# Patient Record
Sex: Female | Born: 1940 | Race: White | Hispanic: No | Smoking: Never smoker
Health system: Southern US, Community
[De-identification: ages and names within clinical notes are randomized; demographics above are authoritative.]

## PROBLEM LIST (undated history)

## (undated) DIAGNOSIS — F419 Anxiety disorder, unspecified: Secondary | ICD-10-CM

## (undated) DIAGNOSIS — G2581 Restless legs syndrome: Secondary | ICD-10-CM

## (undated) DIAGNOSIS — J449 Chronic obstructive pulmonary disease, unspecified: Secondary | ICD-10-CM

## (undated) DIAGNOSIS — M159 Polyosteoarthritis, unspecified: Secondary | ICD-10-CM

## (undated) DIAGNOSIS — E559 Vitamin D deficiency, unspecified: Secondary | ICD-10-CM

## (undated) DIAGNOSIS — M797 Fibromyalgia: Secondary | ICD-10-CM

## (undated) DIAGNOSIS — G894 Chronic pain syndrome: Secondary | ICD-10-CM

## (undated) DIAGNOSIS — I959 Hypotension, unspecified: Secondary | ICD-10-CM

## (undated) DIAGNOSIS — I509 Heart failure, unspecified: Secondary | ICD-10-CM

## (undated) DIAGNOSIS — N200 Calculus of kidney: Secondary | ICD-10-CM

## (undated) DIAGNOSIS — I4891 Unspecified atrial fibrillation: Secondary | ICD-10-CM

## (undated) DIAGNOSIS — G4733 Obstructive sleep apnea (adult) (pediatric): Secondary | ICD-10-CM

## (undated) DIAGNOSIS — I2510 Atherosclerotic heart disease of native coronary artery without angina pectoris: Secondary | ICD-10-CM

## (undated) DIAGNOSIS — M545 Low back pain, unspecified: Secondary | ICD-10-CM

## (undated) DIAGNOSIS — E119 Type 2 diabetes mellitus without complications: Secondary | ICD-10-CM

## (undated) DIAGNOSIS — I639 Cerebral infarction, unspecified: Secondary | ICD-10-CM

## (undated) DIAGNOSIS — E876 Hypokalemia: Secondary | ICD-10-CM

## (undated) DIAGNOSIS — C799 Secondary malignant neoplasm of unspecified site: Secondary | ICD-10-CM

## (undated) DIAGNOSIS — K219 Gastro-esophageal reflux disease without esophagitis: Secondary | ICD-10-CM

## (undated) HISTORY — PX: OTHER SURGICAL HISTORY: SHX169

## (undated) HISTORY — PX: CHOLECYSTECTOMY: SHX55

## (undated) HISTORY — PX: PACEMAKER IMPLANT: EP1218

## (undated) HISTORY — PX: ABDOMINAL HYSTERECTOMY: SHX81

## (undated) HISTORY — PX: CORONARY ARTERY BYPASS GRAFT: SHX141

## (undated) HISTORY — PX: LUNG SURGERY: SHX703

---

## 2017-02-18 ENCOUNTER — Emergency Department: Payer: Medicare Other

## 2017-02-18 ENCOUNTER — Observation Stay
Admission: EM | Admit: 2017-02-18 | Discharge: 2017-02-21 | Disposition: A | Discharge status: Home / Self Care | Payer: Medicare Other | Attending: Internal Medicine | Admitting: Internal Medicine

## 2017-02-18 ENCOUNTER — Encounter: Payer: Self-pay | Admitting: Emergency Medicine

## 2017-02-18 DIAGNOSIS — I951 Orthostatic hypotension: Secondary | ICD-10-CM | POA: Insufficient documentation

## 2017-02-18 DIAGNOSIS — M159 Polyosteoarthritis, unspecified: Secondary | ICD-10-CM | POA: Insufficient documentation

## 2017-02-18 DIAGNOSIS — E1122 Type 2 diabetes mellitus with diabetic chronic kidney disease: Secondary | ICD-10-CM | POA: Diagnosis not present

## 2017-02-18 DIAGNOSIS — Z7982 Long term (current) use of aspirin: Secondary | ICD-10-CM | POA: Insufficient documentation

## 2017-02-18 DIAGNOSIS — E559 Vitamin D deficiency, unspecified: Secondary | ICD-10-CM | POA: Insufficient documentation

## 2017-02-18 DIAGNOSIS — I2510 Atherosclerotic heart disease of native coronary artery without angina pectoris: Secondary | ICD-10-CM | POA: Insufficient documentation

## 2017-02-18 DIAGNOSIS — M797 Fibromyalgia: Secondary | ICD-10-CM | POA: Insufficient documentation

## 2017-02-18 DIAGNOSIS — Z951 Presence of aortocoronary bypass graft: Secondary | ICD-10-CM | POA: Diagnosis not present

## 2017-02-18 DIAGNOSIS — R55 Syncope and collapse: Principal | ICD-10-CM | POA: Diagnosis present

## 2017-02-18 DIAGNOSIS — F419 Anxiety disorder, unspecified: Secondary | ICD-10-CM | POA: Insufficient documentation

## 2017-02-18 DIAGNOSIS — I5022 Chronic systolic (congestive) heart failure: Secondary | ICD-10-CM | POA: Insufficient documentation

## 2017-02-18 DIAGNOSIS — C799 Secondary malignant neoplasm of unspecified site: Secondary | ICD-10-CM | POA: Insufficient documentation

## 2017-02-18 DIAGNOSIS — I130 Hypertensive heart and chronic kidney disease with heart failure and stage 1 through stage 4 chronic kidney disease, or unspecified chronic kidney disease: Secondary | ICD-10-CM | POA: Diagnosis not present

## 2017-02-18 DIAGNOSIS — E1140 Type 2 diabetes mellitus with diabetic neuropathy, unspecified: Secondary | ICD-10-CM | POA: Insufficient documentation

## 2017-02-18 DIAGNOSIS — G4733 Obstructive sleep apnea (adult) (pediatric): Secondary | ICD-10-CM | POA: Diagnosis not present

## 2017-02-18 DIAGNOSIS — I4891 Unspecified atrial fibrillation: Secondary | ICD-10-CM | POA: Diagnosis not present

## 2017-02-18 DIAGNOSIS — K219 Gastro-esophageal reflux disease without esophagitis: Secondary | ICD-10-CM | POA: Insufficient documentation

## 2017-02-18 DIAGNOSIS — R51 Headache: Secondary | ICD-10-CM | POA: Insufficient documentation

## 2017-02-18 DIAGNOSIS — G894 Chronic pain syndrome: Secondary | ICD-10-CM | POA: Diagnosis not present

## 2017-02-18 DIAGNOSIS — Z8673 Personal history of transient ischemic attack (TIA), and cerebral infarction without residual deficits: Secondary | ICD-10-CM | POA: Diagnosis not present

## 2017-02-18 DIAGNOSIS — N183 Chronic kidney disease, stage 3 (moderate): Secondary | ICD-10-CM | POA: Diagnosis not present

## 2017-02-18 DIAGNOSIS — Z95810 Presence of automatic (implantable) cardiac defibrillator: Secondary | ICD-10-CM | POA: Insufficient documentation

## 2017-02-18 DIAGNOSIS — Z794 Long term (current) use of insulin: Secondary | ICD-10-CM | POA: Diagnosis not present

## 2017-02-18 DIAGNOSIS — Z79899 Other long term (current) drug therapy: Secondary | ICD-10-CM | POA: Diagnosis not present

## 2017-02-18 HISTORY — DX: Atherosclerotic heart disease of native coronary artery without angina pectoris: I25.10

## 2017-02-18 HISTORY — DX: Polyosteoarthritis, unspecified: M15.9

## 2017-02-18 HISTORY — DX: Low back pain: M54.5

## 2017-02-18 HISTORY — DX: Anxiety disorder, unspecified: F41.9

## 2017-02-18 HISTORY — DX: Low back pain, unspecified: M54.50

## 2017-02-18 HISTORY — DX: Calculus of kidney: N20.0

## 2017-02-18 HISTORY — DX: Secondary malignant neoplasm of unspecified site: C79.9

## 2017-02-18 HISTORY — DX: Hypotension, unspecified: I95.9

## 2017-02-18 HISTORY — DX: Hypokalemia: E87.6

## 2017-02-18 HISTORY — DX: Obstructive sleep apnea (adult) (pediatric): G47.33

## 2017-02-18 HISTORY — DX: Vitamin D deficiency, unspecified: E55.9

## 2017-02-18 HISTORY — DX: Type 2 diabetes mellitus without complications: E11.9

## 2017-02-18 HISTORY — DX: Chronic obstructive pulmonary disease, unspecified: J44.9

## 2017-02-18 HISTORY — DX: Chronic pain syndrome: G89.4

## 2017-02-18 HISTORY — DX: Unspecified atrial fibrillation: I48.91

## 2017-02-18 HISTORY — DX: Fibromyalgia: M79.7

## 2017-02-18 HISTORY — DX: Cerebral infarction, unspecified: I63.9

## 2017-02-18 HISTORY — DX: Gastro-esophageal reflux disease without esophagitis: K21.9

## 2017-02-18 HISTORY — DX: Heart failure, unspecified: I50.9

## 2017-02-18 HISTORY — DX: Restless legs syndrome: G25.81

## 2017-02-18 LAB — GLUCOSE, CAPILLARY
Glucose-Capillary: 143 mg/dL — ABNORMAL HIGH (ref 65–99)
Glucose-Capillary: 170 mg/dL — ABNORMAL HIGH (ref 65–99)

## 2017-02-18 LAB — BASIC METABOLIC PANEL
Anion gap: 8 (ref 5–15)
BUN: 10 mg/dL (ref 6–20)
CALCIUM: 9.5 mg/dL (ref 8.9–10.3)
CHLORIDE: 103 mmol/L (ref 101–111)
CO2: 25 mmol/L (ref 22–32)
Creatinine, Ser: 1.1 mg/dL — ABNORMAL HIGH (ref 0.44–1.00)
GFR calc non Af Amer: 48 mL/min — ABNORMAL LOW (ref >60)
GFR, EST AFRICAN AMERICAN: 55 mL/min — AB (ref >60)
GLUCOSE: 318 mg/dL — AB (ref 65–99)
Potassium: 4.1 mmol/L (ref 3.5–5.1)
SODIUM: 136 mmol/L (ref 135–145)

## 2017-02-18 LAB — CBC
HEMATOCRIT: 37.5 % (ref 35.0–47.0)
HEMOGLOBIN: 12.3 g/dL (ref 12.0–16.0)
MCH: 26.7 pg (ref 26.0–34.0)
MCHC: 33 g/dL (ref 32.0–36.0)
MCV: 81 fL (ref 80.0–100.0)
Platelets: 260 10*3/uL (ref 150–440)
RBC: 4.62 MIL/uL (ref 3.80–5.20)
RDW: 17.4 % — ABNORMAL HIGH (ref 11.5–14.5)
WBC: 5.7 10*3/uL (ref 3.6–11.0)

## 2017-02-18 LAB — TROPONIN I: Troponin I: <0.03 ng/mL (ref <0.03)

## 2017-02-18 LAB — TSH: TSH: 4.381 u[IU]/mL (ref 0.350–4.500)

## 2017-02-18 MED ORDER — ACETAMINOPHEN 325 MG PO TABS: 650.0000 mg | ORAL_TABLET | Freq: Four times a day (QID) | ORAL | Status: DC | PRN

## 2017-02-18 MED ORDER — INSULIN GLARGINE 100 UNIT/ML ~~LOC~~ SOLN
18.0000 [IU] | Freq: Every day | SUBCUTANEOUS | Status: DC
  Administered 2017-02-18 – 2017-02-20 (×3): 18 [IU] via SUBCUTANEOUS
  Filled 2017-02-18 (×4): qty 0.18

## 2017-02-18 MED ORDER — FERROUS SULFATE 325 (65 FE) MG PO TABS
325.0000 mg | ORAL_TABLET | Freq: Every day | ORAL | Status: DC
  Administered 2017-02-19 – 2017-02-21 (×3): 325 mg via ORAL
  Filled 2017-02-18 (×3): qty 1

## 2017-02-18 MED ORDER — BUDESONIDE 0.5 MG/2ML IN SUSP
2.0000 mL | Freq: Two times a day (BID) | RESPIRATORY_TRACT | Status: DC
Start: 2017-02-18 — End: 2017-02-21
  Administered 2017-02-18 – 2017-02-21 (×6): 0.5 mg via RESPIRATORY_TRACT
  Filled 2017-02-18 (×7): qty 2

## 2017-02-18 MED ORDER — SODIUM CHLORIDE 0.9% FLUSH
3.0000 mL | INTRAVENOUS | Status: DC | PRN
  Administered 2017-02-20: 3 mL via INTRAVENOUS
  Filled 2017-02-18: qty 3

## 2017-02-18 MED ORDER — POTASSIUM CHLORIDE 20 MEQ PO PACK
10.0000 meq | PACK | Freq: Every day | ORAL | Status: DC
  Administered 2017-02-19 – 2017-02-21 (×3): 10 meq via ORAL
  Filled 2017-02-18 (×3): qty 1

## 2017-02-18 MED ORDER — OXYCODONE HCL 5 MG PO TABS
10.0000 mg | ORAL_TABLET | Freq: Once | ORAL | Status: AC
  Administered 2017-02-18: 10 mg via ORAL

## 2017-02-18 MED ORDER — BUDESONIDE 0.5 MG/2ML IN SUSP: 2.0000 mL | Freq: Two times a day (BID) | RESPIRATORY_TRACT | Status: DC

## 2017-02-18 MED ORDER — GLIMEPIRIDE 4 MG PO TABS
4.0000 mg | ORAL_TABLET | Freq: Every day | ORAL | Status: DC
  Administered 2017-02-19 – 2017-02-21 (×3): 4 mg via ORAL
  Filled 2017-02-18 (×3): qty 1

## 2017-02-18 MED ORDER — TORSEMIDE 20 MG PO TABS
20.0000 mg | ORAL_TABLET | Freq: Every day | ORAL | Status: DC
  Administered 2017-02-18 – 2017-02-21 (×4): 20 mg via ORAL
  Filled 2017-02-18 (×4): qty 1

## 2017-02-18 MED ORDER — ASPIRIN EC 81 MG PO TBEC
81.0000 mg | DELAYED_RELEASE_TABLET | Freq: Every day | ORAL | Status: DC
  Administered 2017-02-19 – 2017-02-21 (×3): 81 mg via ORAL
  Filled 2017-02-18 (×3): qty 1

## 2017-02-18 MED ORDER — PANTOPRAZOLE SODIUM 40 MG PO TBEC
40.0000 mg | DELAYED_RELEASE_TABLET | Freq: Every day | ORAL | Status: DC
  Administered 2017-02-19 – 2017-02-21 (×3): 40 mg via ORAL
  Filled 2017-02-18 (×3): qty 1

## 2017-02-18 MED ORDER — ENOXAPARIN SODIUM 40 MG/0.4ML ~~LOC~~ SOLN
40.0000 mg | SUBCUTANEOUS | Status: DC
  Administered 2017-02-18 – 2017-02-20 (×3): 40 mg via SUBCUTANEOUS
  Filled 2017-02-18 (×4): qty 0.4

## 2017-02-18 MED ORDER — OXYCODONE HCL 5 MG PO TABS
10.0000 mg | ORAL_TABLET | Freq: Four times a day (QID) | ORAL | Status: DC | PRN
  Administered 2017-02-18 – 2017-02-20 (×2): 10 mg via ORAL
  Filled 2017-02-18 (×2): qty 2

## 2017-02-18 MED ORDER — SODIUM CHLORIDE 0.9% FLUSH
3.0000 mL | Freq: Two times a day (BID) | INTRAVENOUS | Status: DC
  Administered 2017-02-18 – 2017-02-21 (×5): 3 mL via INTRAVENOUS

## 2017-02-18 MED ORDER — ATORVASTATIN CALCIUM 20 MG PO TABS
80.0000 mg | ORAL_TABLET | Freq: Every evening | ORAL | Status: DC
  Administered 2017-02-18 – 2017-02-20 (×3): 80 mg via ORAL
  Filled 2017-02-18 (×3): qty 4

## 2017-02-18 MED ORDER — OXYCODONE HCL 5 MG PO TABS
ORAL_TABLET | ORAL | Status: AC
  Administered 2017-02-18: 17:00:00
  Filled 2017-02-18: qty 2

## 2017-02-18 MED ORDER — ACETAMINOPHEN 650 MG RE SUPP
650.0000 mg | Freq: Four times a day (QID) | RECTAL | Status: DC | PRN
Start: 2017-02-18 — End: 2017-02-21

## 2017-02-18 MED ORDER — INSULIN ASPART 100 UNIT/ML ~~LOC~~ SOLN
0.0000 [IU] | Freq: Three times a day (TID) | SUBCUTANEOUS | Status: DC
  Administered 2017-02-18 – 2017-02-19 (×2): 1 [IU] via SUBCUTANEOUS
  Administered 2017-02-19: 2 [IU] via SUBCUTANEOUS
  Administered 2017-02-19: 1 [IU] via SUBCUTANEOUS
  Administered 2017-02-20 – 2017-02-21 (×3): 2 [IU] via SUBCUTANEOUS
  Filled 2017-02-18 (×2): qty 2
  Filled 2017-02-18: qty 1
  Filled 2017-02-18 (×2): qty 2
  Filled 2017-02-18 (×2): qty 1

## 2017-02-18 MED ORDER — PRAMIPEXOLE DIHYDROCHLORIDE 0.25 MG PO TABS
0.5000 mg | ORAL_TABLET | Freq: Two times a day (BID) | ORAL | Status: DC
  Administered 2017-02-18 – 2017-02-21 (×6): 0.5 mg via ORAL
  Filled 2017-02-18 (×6): qty 2

## 2017-02-18 MED ORDER — ISOSORBIDE MONONITRATE ER 30 MG PO TB24
30.0000 mg | ORAL_TABLET | Freq: Every day | ORAL | Status: DC
  Administered 2017-02-18 – 2017-02-19 (×2): 30 mg via ORAL
  Filled 2017-02-18 (×2): qty 1

## 2017-02-18 MED ORDER — SODIUM CHLORIDE 0.9 % IV SOLN: 250.0000 mL | INTRAVENOUS | Status: DC | PRN

## 2017-02-18 MED ORDER — ONDANSETRON HCL 4 MG/2ML IJ SOLN: 4.0000 mg | Freq: Four times a day (QID) | INTRAMUSCULAR | Status: DC | PRN

## 2017-02-18 MED ORDER — ONDANSETRON HCL 4 MG PO TABS: 4.0000 mg | ORAL_TABLET | Freq: Four times a day (QID) | ORAL | Status: DC | PRN

## 2017-02-18 MED ORDER — DULOXETINE HCL 30 MG PO CPEP
60.0000 mg | ORAL_CAPSULE | Freq: Two times a day (BID) | ORAL | Status: DC
  Administered 2017-02-18 – 2017-02-21 (×6): 60 mg via ORAL
  Filled 2017-02-18 (×6): qty 2

## 2017-02-18 MED ORDER — GABAPENTIN 300 MG PO CAPS
600.0000 mg | ORAL_CAPSULE | Freq: Two times a day (BID) | ORAL | Status: DC
  Administered 2017-02-18 – 2017-02-21 (×6): 600 mg via ORAL
  Filled 2017-02-18 (×6): qty 2

## 2017-02-18 MED ORDER — VITAMIN D 1000 UNITS PO TABS
2000.0000 [IU] | ORAL_TABLET | Freq: Every day | ORAL | Status: DC
  Administered 2017-02-19 – 2017-02-21 (×3): 2000 [IU] via ORAL
  Filled 2017-02-18 (×3): qty 2

## 2017-02-18 MED ORDER — AZELASTINE HCL 0.1 % NA SOLN
1.0000 | Freq: Two times a day (BID) | NASAL | Status: DC | PRN
  Filled 2017-02-18: qty 30

## 2017-02-18 MED ORDER — CARVEDILOL 6.25 MG PO TABS
6.2500 mg | ORAL_TABLET | Freq: Two times a day (BID) | ORAL | Status: DC
  Administered 2017-02-18 – 2017-02-19 (×2): 6.25 mg via ORAL
  Filled 2017-02-18 (×2): qty 1

## 2017-02-18 NOTE — ED Notes (Signed)
Darlene with [employer] has an ETA of 45 min to 1 hr in order to interrogate patient's pacemaker.  This RN verbalized understanding of this information and updated the MD and patient.
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
## 2017-02-18 NOTE — ED Notes (Signed)
MD Malinda at bedside. 
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
## 2017-02-19 ENCOUNTER — Observation Stay: Payer: Medicare Other

## 2017-02-19 ENCOUNTER — Observation Stay
Admit: 2017-02-19 | Discharge: 2017-02-19 | Disposition: A | Discharge status: Home / Self Care | Payer: Medicare Other | Attending: Internal Medicine | Admitting: Internal Medicine

## 2017-02-19 DIAGNOSIS — R55 Syncope and collapse: Secondary | ICD-10-CM | POA: Diagnosis not present

## 2017-02-19 LAB — GLUCOSE, CAPILLARY
GLUCOSE-CAPILLARY: 126 mg/dL — AB (ref 65–99)
GLUCOSE-CAPILLARY: 125 mg/dL — AB (ref 65–99)
GLUCOSE-CAPILLARY: 211 mg/dL — AB (ref 65–99)
Glucose-Capillary: 182 mg/dL — ABNORMAL HIGH (ref 65–99)

## 2017-02-19 LAB — CBC
HEMATOCRIT: 34.4 % — AB (ref 35.0–47.0)
Hemoglobin: 11.4 g/dL — ABNORMAL LOW (ref 12.0–16.0)
MCH: 26.9 pg (ref 26.0–34.0)
MCHC: 33.1 g/dL (ref 32.0–36.0)
MCV: 81.2 fL (ref 80.0–100.0)
Platelets: 254 10*3/uL (ref 150–440)
RBC: 4.23 MIL/uL (ref 3.80–5.20)
RDW: 17.8 % — ABNORMAL HIGH (ref 11.5–14.5)
WBC: 5.9 10*3/uL (ref 3.6–11.0)

## 2017-02-19 LAB — BASIC METABOLIC PANEL
ANION GAP: 8 (ref 5–15)
BUN: 10 mg/dL (ref 6–20)
CHLORIDE: 100 mmol/L — AB (ref 101–111)
CO2: 28 mmol/L (ref 22–32)
CREATININE: 0.91 mg/dL (ref 0.44–1.00)
Calcium: 9.1 mg/dL (ref 8.9–10.3)
GFR calc Af Amer: >60 mL/min (ref >60)
Glucose, Bld: 174 mg/dL — ABNORMAL HIGH (ref 65–99)
Potassium: 4 mmol/L (ref 3.5–5.1)
Sodium: 136 mmol/L (ref 135–145)

## 2017-02-19 LAB — URINALYSIS, COMPLETE (UACMP) WITH MICROSCOPIC
BACTERIA UA: NONE SEEN
Bilirubin Urine: NEGATIVE
Glucose, UA: NEGATIVE mg/dL
Hgb urine dipstick: NEGATIVE
KETONES UR: NEGATIVE mg/dL
Leukocytes, UA: NEGATIVE
Nitrite: NEGATIVE
PROTEIN: NEGATIVE mg/dL
Specific Gravity, Urine: 1.008 (ref 1.005–1.030)
pH: 5 (ref 5.0–8.0)

## 2017-02-19 LAB — TROPONIN I: Troponin I: <0.03 ng/mL (ref <0.03)

## 2017-02-19 LAB — URINE DRUG SCREEN, QUALITATIVE (ARMC ONLY)
Amphetamines, Ur Screen: NOT DETECTED
BARBITURATES, UR SCREEN: NOT DETECTED
BENZODIAZEPINE, UR SCRN: NOT DETECTED
Cannabinoid 50 Ng, Ur ~~LOC~~: NOT DETECTED
Cocaine Metabolite,Ur ~~LOC~~: NOT DETECTED
MDMA (Ecstasy)Ur Screen: NOT DETECTED
Methadone Scn, Ur: NOT DETECTED
Opiate, Ur Screen: NOT DETECTED
Phencyclidine (PCP) Ur S: NOT DETECTED
TRICYCLIC, UR SCREEN: NOT DETECTED

## 2017-02-19 MED ORDER — SODIUM CHLORIDE 0.9 % IV SOLN
INTRAVENOUS | Status: AC
  Administered 2017-02-19: 15:00:00 via INTRAVENOUS
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
## 2017-02-19 NOTE — Progress Notes (Signed)
*  PRELIMINARY RESULTS* Echocardiogram 2D Echocardiogram has been performed.  Christina Hess 02/19/2017, 12:07 PM
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
## 2017-02-19 NOTE — Progress Notes (Signed)
PT Cancellation Note  Patient Details Name: Christina Hess MRN: [id number] DOB: 1941/04/22   Cancelled Treatment:    Reason Eval/Treat Not Completed: Patient at procedure or test/unavailable (Treatment attempted x3, pt off floor for procedure each time. Will attempt again at later date/time as pt is available. )  1:56 PM, 02/19/17 Etta Grandchild, PT, DPT Physical Therapist - [facility])  [phone] (mobile)   Buccola,Allan C 02/19/2017, 1:56 PM
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
## 2017-02-20 DIAGNOSIS — R55 Syncope and collapse: Secondary | ICD-10-CM | POA: Diagnosis not present

## 2017-02-20 LAB — GLUCOSE, CAPILLARY
GLUCOSE-CAPILLARY: 192 mg/dL — AB (ref 65–99)
GLUCOSE-CAPILLARY: 164 mg/dL — AB (ref 65–99)
Glucose-Capillary: 118 mg/dL — ABNORMAL HIGH (ref 65–99)
Glucose-Capillary: 169 mg/dL — ABNORMAL HIGH (ref 65–99)

## 2017-02-20 MED ORDER — SENNOSIDES-DOCUSATE SODIUM 8.6-50 MG PO TABS
2.0000 | ORAL_TABLET | Freq: Every day | ORAL | Status: DC
  Administered 2017-02-20 – 2017-02-21 (×2): 2 via ORAL
  Filled 2017-02-20 (×2): qty 2

## 2017-02-20 MED ORDER — GABAPENTIN 300 MG PO CAPS: 300.0000 mg | ORAL_CAPSULE | Freq: Two times a day (BID) | ORAL | 2 refills | Status: AC

## 2017-02-20 MED ORDER — MIDODRINE HCL 5 MG PO TABS
5.0000 mg | ORAL_TABLET | Freq: Three times a day (TID) | ORAL | Status: DC
  Administered 2017-02-20 – 2017-02-21 (×3): 5 mg via ORAL
  Filled 2017-02-20 (×3): qty 1

## 2017-02-20 NOTE — Care Management Obs Status (Signed)
[facility] NOTIFICATION   Patient Details  Name: Christina Hess MRN: [id number] Date of Birth: Dec 01, 1940   Medicare Observation Status Notification Given:  Yes Gave notice 02/19/2017.  Forgot to document   Katrina Stack, RN 02/20/2017, 1:48 PM

## 2017-02-20 NOTE — Care Management (Signed)
Patient does not feel she needs physical therapy at home.  Has access to a walker and cane.  Her sister lives with her.  her PCP is Silvio Pate at [facility].   Asked if she has [employer].  Says she has medicare, and UMR through [place] .  She says her insurance card is in her son's truck.  Asked that he be asked to bring it tomorrow so can be copied and placed in record
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
## 2017-02-21 DIAGNOSIS — R55 Syncope and collapse: Secondary | ICD-10-CM | POA: Diagnosis not present

## 2017-02-21 LAB — GLUCOSE, CAPILLARY
Glucose-Capillary: 111 mg/dL — ABNORMAL HIGH (ref 65–99)
Glucose-Capillary: 194 mg/dL — ABNORMAL HIGH (ref 65–99)
Glucose-Capillary: 113 mg/dL — ABNORMAL HIGH (ref 65–99)

## 2017-02-21 MED ORDER — OMEPRAZOLE 20 MG PO CPDR: 20.0000 mg | DELAYED_RELEASE_CAPSULE | Freq: Every day | ORAL | 2 refills | Status: AC

## 2017-02-21 MED ORDER — MIDODRINE HCL 5 MG PO TABS: 5.0000 mg | ORAL_TABLET | Freq: Three times a day (TID) | ORAL | 0 refills | Status: DC

## 2017-02-21 MED ORDER — ACYCLOVIR 5 % EX OINT
TOPICAL_OINTMENT | CUTANEOUS | Status: DC
  Administered 2017-02-21: 15:00:00 via TOPICAL
  Filled 2017-02-21: qty 15

## 2017-02-21 NOTE — Care Management (Signed)
Patient has again declined the need for home health physical therapy. Notified attending.

## 2017-02-21 NOTE — Progress Notes (Signed)
Discharge canceled yesterday due to orthostatic hypotension, Improved with midodrine Discharge today. Updated son Elberta Fortis over the phone and agreeable.
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
## 2017-02-21 NOTE — Progress Notes (Signed)
Patient will be discharged with her son, when he arrives at about 16:00.  IV dc'd.  Discharge instructions regarding med changes, new med, and follow up appointments.  I had to stress to her that the doctors who took care of her here are not available to care for her outside of the hospital.  She has a follow up appointment scheduled with her PCP.  I explained this to her and emphasized that the appointment has been made for her.

## 2017-02-21 NOTE — Plan of Care (Signed)
Problem: Health Behavior/Discharge Planning: Goal: Ability to manage health-related needs will improve Outcome: Completed/Met Date Met: 02/21/17 Discharge instructions regarding medications and changes, follow up appointments, and discharge instructions
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
## 2017-03-01 LAB — ECHOCARDIOGRAM COMPLETE
HEIGHTINCHES: 62 in
Weight: 2884.8 oz

## 2017-06-18 ENCOUNTER — Observation Stay
Admission: EM | Admit: 2017-06-18 | Discharge: 2017-06-21 | Disposition: A | Discharge status: Home / Self Care | Payer: No Typology Code available for payment source | Attending: Internal Medicine | Admitting: Internal Medicine

## 2017-06-18 ENCOUNTER — Emergency Department: Payer: No Typology Code available for payment source

## 2017-06-18 ENCOUNTER — Encounter: Payer: Self-pay | Admitting: Emergency Medicine

## 2017-06-18 DIAGNOSIS — C7951 Secondary malignant neoplasm of bone: Secondary | ICD-10-CM | POA: Insufficient documentation

## 2017-06-18 DIAGNOSIS — S32010A Wedge compression fracture of first lumbar vertebra, initial encounter for closed fracture: Secondary | ICD-10-CM | POA: Diagnosis not present

## 2017-06-18 DIAGNOSIS — Z87442 Personal history of urinary calculi: Secondary | ICD-10-CM | POA: Diagnosis not present

## 2017-06-18 DIAGNOSIS — X58XXXA Exposure to other specified factors, initial encounter: Secondary | ICD-10-CM | POA: Diagnosis not present

## 2017-06-18 DIAGNOSIS — I2510 Atherosclerotic heart disease of native coronary artery without angina pectoris: Secondary | ICD-10-CM | POA: Diagnosis not present

## 2017-06-18 DIAGNOSIS — E119 Type 2 diabetes mellitus without complications: Secondary | ICD-10-CM | POA: Insufficient documentation

## 2017-06-18 DIAGNOSIS — F419 Anxiety disorder, unspecified: Secondary | ICD-10-CM | POA: Insufficient documentation

## 2017-06-18 DIAGNOSIS — G2581 Restless legs syndrome: Secondary | ICD-10-CM | POA: Insufficient documentation

## 2017-06-18 DIAGNOSIS — R4182 Altered mental status, unspecified: Secondary | ICD-10-CM | POA: Insufficient documentation

## 2017-06-18 DIAGNOSIS — I110 Hypertensive heart disease with heart failure: Secondary | ICD-10-CM | POA: Insufficient documentation

## 2017-06-18 DIAGNOSIS — G3189 Other specified degenerative diseases of nervous system: Secondary | ICD-10-CM | POA: Insufficient documentation

## 2017-06-18 DIAGNOSIS — K219 Gastro-esophageal reflux disease without esophagitis: Secondary | ICD-10-CM | POA: Diagnosis not present

## 2017-06-18 DIAGNOSIS — I7389 Other specified peripheral vascular diseases: Secondary | ICD-10-CM | POA: Insufficient documentation

## 2017-06-18 DIAGNOSIS — Z7982 Long term (current) use of aspirin: Secondary | ICD-10-CM | POA: Insufficient documentation

## 2017-06-18 DIAGNOSIS — Z888 Allergy status to other drugs, medicaments and biological substances status: Secondary | ICD-10-CM | POA: Insufficient documentation

## 2017-06-18 DIAGNOSIS — M159 Polyosteoarthritis, unspecified: Secondary | ICD-10-CM | POA: Diagnosis not present

## 2017-06-18 DIAGNOSIS — I509 Heart failure, unspecified: Secondary | ICD-10-CM | POA: Diagnosis not present

## 2017-06-18 DIAGNOSIS — Z419 Encounter for procedure for purposes other than remedying health state, unspecified: Secondary | ICD-10-CM

## 2017-06-18 DIAGNOSIS — C799 Secondary malignant neoplasm of unspecified site: Secondary | ICD-10-CM | POA: Diagnosis present

## 2017-06-18 DIAGNOSIS — G894 Chronic pain syndrome: Secondary | ICD-10-CM | POA: Diagnosis not present

## 2017-06-18 DIAGNOSIS — J449 Chronic obstructive pulmonary disease, unspecified: Secondary | ICD-10-CM | POA: Diagnosis present

## 2017-06-18 DIAGNOSIS — Z794 Long term (current) use of insulin: Secondary | ICD-10-CM | POA: Diagnosis not present

## 2017-06-18 DIAGNOSIS — Z853 Personal history of malignant neoplasm of breast: Secondary | ICD-10-CM | POA: Insufficient documentation

## 2017-06-18 DIAGNOSIS — F05 Delirium due to known physiological condition: Secondary | ICD-10-CM | POA: Diagnosis not present

## 2017-06-18 DIAGNOSIS — E559 Vitamin D deficiency, unspecified: Secondary | ICD-10-CM | POA: Insufficient documentation

## 2017-06-18 DIAGNOSIS — Z8673 Personal history of transient ischemic attack (TIA), and cerebral infarction without residual deficits: Secondary | ICD-10-CM | POA: Insufficient documentation

## 2017-06-18 DIAGNOSIS — G4733 Obstructive sleep apnea (adult) (pediatric): Secondary | ICD-10-CM | POA: Diagnosis not present

## 2017-06-18 DIAGNOSIS — M797 Fibromyalgia: Secondary | ICD-10-CM | POA: Insufficient documentation

## 2017-06-18 DIAGNOSIS — I4891 Unspecified atrial fibrillation: Secondary | ICD-10-CM | POA: Insufficient documentation

## 2017-06-18 DIAGNOSIS — M48061 Spinal stenosis, lumbar region without neurogenic claudication: Secondary | ICD-10-CM | POA: Insufficient documentation

## 2017-06-18 DIAGNOSIS — Z95810 Presence of automatic (implantable) cardiac defibrillator: Secondary | ICD-10-CM | POA: Insufficient documentation

## 2017-06-18 DIAGNOSIS — Z951 Presence of aortocoronary bypass graft: Secondary | ICD-10-CM | POA: Insufficient documentation

## 2017-06-18 DIAGNOSIS — I10 Essential (primary) hypertension: Secondary | ICD-10-CM | POA: Diagnosis present

## 2017-06-18 DIAGNOSIS — Z79899 Other long term (current) drug therapy: Secondary | ICD-10-CM | POA: Insufficient documentation

## 2017-06-18 DIAGNOSIS — S32000A Wedge compression fracture of unspecified lumbar vertebra, initial encounter for closed fracture: Secondary | ICD-10-CM

## 2017-06-18 LAB — CBC WITH DIFFERENTIAL/PLATELET
Basophils Absolute: 0.1 10*3/uL (ref 0–0.1)
Basophils Relative: 1 %
EOS ABS: 0.2 10*3/uL (ref 0–0.7)
Eosinophils Relative: 2 %
HCT: 35 % (ref 35.0–47.0)
Hemoglobin: 11.5 g/dL — ABNORMAL LOW (ref 12.0–16.0)
LYMPHS ABS: 1.8 10*3/uL (ref 1.0–3.6)
Lymphocytes Relative: 20 %
MCH: 28.3 pg (ref 26.0–34.0)
MCHC: 32.9 g/dL (ref 32.0–36.0)
MCV: 86 fL (ref 80.0–100.0)
MONOS PCT: 9 %
Monocytes Absolute: 0.8 10*3/uL (ref 0.2–0.9)
Neutro Abs: 6.1 10*3/uL (ref 1.4–6.5)
Neutrophils Relative %: 68 %
PLATELETS: 279 10*3/uL (ref 150–440)
RBC: 4.07 MIL/uL (ref 3.80–5.20)
RDW: 16.4 % — ABNORMAL HIGH (ref 11.5–14.5)
WBC: 8.8 10*3/uL (ref 3.6–11.0)

## 2017-06-18 LAB — COMPREHENSIVE METABOLIC PANEL
ALT: 13 U/L — AB (ref 14–54)
AST: 26 U/L (ref 15–41)
Albumin: 3.3 g/dL — ABNORMAL LOW (ref 3.5–5.0)
Alkaline Phosphatase: 198 U/L — ABNORMAL HIGH (ref 38–126)
Anion gap: 10 (ref 5–15)
BUN: 18 mg/dL (ref 6–20)
CHLORIDE: 100 mmol/L — AB (ref 101–111)
CO2: 25 mmol/L (ref 22–32)
CREATININE: 0.69 mg/dL (ref 0.44–1.00)
Calcium: 9.6 mg/dL (ref 8.9–10.3)
GFR calc Af Amer: >60 mL/min (ref >60)
GFR calc non Af Amer: >60 mL/min (ref >60)
Glucose, Bld: 102 mg/dL — ABNORMAL HIGH (ref 65–99)
Potassium: 5.2 mmol/L — ABNORMAL HIGH (ref 3.5–5.1)
Sodium: 135 mmol/L (ref 135–145)
Total Bilirubin: 1 mg/dL (ref 0.3–1.2)
Total Protein: 7.2 g/dL (ref 6.5–8.1)

## 2017-06-18 LAB — GLUCOSE, CAPILLARY
GLUCOSE-CAPILLARY: 172 mg/dL — AB (ref 65–99)
Glucose-Capillary: 78 mg/dL (ref 65–99)

## 2017-06-18 LAB — SURGICAL PCR SCREEN
MRSA, PCR: NEGATIVE
Staphylococcus aureus: NEGATIVE

## 2017-06-18 MED ORDER — ONDANSETRON HCL 4 MG/2ML IJ SOLN
4.0000 mg | Freq: Once | INTRAMUSCULAR | Status: AC
  Administered 2017-06-18: 4 mg via INTRAVENOUS
  Filled 2017-06-18: qty 2

## 2017-06-18 MED ORDER — HYDROMORPHONE HCL 1 MG/ML IJ SOLN
0.5000 mg | Freq: Once | INTRAMUSCULAR | Status: AC
  Administered 2017-06-18: 0.5 mg via INTRAVENOUS
  Filled 2017-06-18: qty 1

## 2017-06-18 MED ORDER — ACETAMINOPHEN 325 MG PO TABS: 650.0000 mg | ORAL_TABLET | Freq: Four times a day (QID) | ORAL | Status: DC | PRN

## 2017-06-18 MED ORDER — PANTOPRAZOLE SODIUM 40 MG PO TBEC
40.0000 mg | DELAYED_RELEASE_TABLET | Freq: Every day | ORAL | Status: DC
  Administered 2017-06-21: 40 mg via ORAL
  Filled 2017-06-18: qty 1

## 2017-06-18 MED ORDER — INSULIN ASPART 100 UNIT/ML ~~LOC~~ SOLN
0.0000 [IU] | Freq: Three times a day (TID) | SUBCUTANEOUS | Status: DC
  Administered 2017-06-20 (×2): 1 [IU] via SUBCUTANEOUS
  Administered 2017-06-21: 2 [IU] via SUBCUTANEOUS
  Filled 2017-06-18 (×3): qty 1

## 2017-06-18 MED ORDER — ONDANSETRON HCL 4 MG PO TABS: 4.0000 mg | ORAL_TABLET | Freq: Four times a day (QID) | ORAL | Status: DC | PRN

## 2017-06-18 MED ORDER — INSULIN ASPART 100 UNIT/ML ~~LOC~~ SOLN: 0.0000 [IU] | Freq: Every day | SUBCUTANEOUS | Status: DC

## 2017-06-18 MED ORDER — CEFAZOLIN SODIUM-DEXTROSE 2-4 GM/100ML-% IV SOLN
2.0000 g | Freq: Once | INTRAVENOUS | Status: AC
  Administered 2017-06-20: 2 g via INTRAVENOUS
  Filled 2017-06-18: qty 100

## 2017-06-18 MED ORDER — FLUTICASONE PROPIONATE (INHAL) 50 MCG/BLIST IN AEPB: 1.0000 | INHALATION_SPRAY | Freq: Two times a day (BID) | RESPIRATORY_TRACT | Status: DC

## 2017-06-18 MED ORDER — FENTANYL CITRATE (PF) 100 MCG/2ML IJ SOLN
12.5000 ug | Freq: Once | INTRAMUSCULAR | Status: AC
  Administered 2017-06-18: 12.5 ug via INTRAVENOUS
  Filled 2017-06-18: qty 2

## 2017-06-18 MED ORDER — ACETAMINOPHEN 650 MG RE SUPP: 650.0000 mg | Freq: Four times a day (QID) | RECTAL | Status: DC | PRN

## 2017-06-18 MED ORDER — BUDESONIDE 0.25 MG/2ML IN SUSP
0.2500 mg | Freq: Two times a day (BID) | RESPIRATORY_TRACT | Status: DC
  Administered 2017-06-18 – 2017-06-21 (×6): 0.25 mg via RESPIRATORY_TRACT
  Filled 2017-06-18 (×6): qty 2

## 2017-06-18 MED ORDER — ENOXAPARIN SODIUM 40 MG/0.4ML ~~LOC~~ SOLN: 40.0000 mg | SUBCUTANEOUS | Status: DC

## 2017-06-18 MED ORDER — ONDANSETRON HCL 4 MG/2ML IJ SOLN: 4.0000 mg | Freq: Four times a day (QID) | INTRAMUSCULAR | Status: DC | PRN

## 2017-06-18 MED ORDER — IOPAMIDOL (ISOVUE-300) INJECTION 61%
100.0000 mL | Freq: Once | INTRAVENOUS | Status: AC | PRN
  Administered 2017-06-18: 100 mL via INTRAVENOUS

## 2017-06-18 MED ORDER — DULOXETINE HCL 60 MG PO CPEP
60.0000 mg | ORAL_CAPSULE | Freq: Two times a day (BID) | ORAL | Status: DC
  Administered 2017-06-18 – 2017-06-21 (×4): 60 mg via ORAL
  Filled 2017-06-18 (×7): qty 1

## 2017-06-18 MED ORDER — HYDROMORPHONE HCL 1 MG/ML IJ SOLN
0.5000 mg | INTRAMUSCULAR | Status: DC | PRN
  Administered 2017-06-19 – 2017-06-21 (×5): 0.5 mg via INTRAVENOUS
  Filled 2017-06-18 (×5): qty 1

## 2017-06-18 MED ORDER — OXYCODONE HCL 5 MG PO TABS
10.0000 mg | ORAL_TABLET | ORAL | Status: DC | PRN
  Administered 2017-06-18 – 2017-06-21 (×14): 10 mg via ORAL
  Filled 2017-06-18 (×15): qty 2

## 2017-06-18 MED ORDER — ASPIRIN EC 81 MG PO TBEC
81.0000 mg | DELAYED_RELEASE_TABLET | Freq: Every day | ORAL | Status: DC
  Administered 2017-06-21: 81 mg via ORAL
  Filled 2017-06-18: qty 1

## 2017-06-18 MED ORDER — ATORVASTATIN CALCIUM 20 MG PO TABS
80.0000 mg | ORAL_TABLET | Freq: Every day | ORAL | Status: DC
  Administered 2017-06-21: 80 mg via ORAL
  Filled 2017-06-18: qty 4

## 2017-06-18 MED ORDER — CARVEDILOL 3.125 MG PO TABS
6.2500 mg | ORAL_TABLET | Freq: Two times a day (BID) | ORAL | Status: DC
  Administered 2017-06-18 – 2017-06-21 (×6): 6.25 mg via ORAL
  Filled 2017-06-18 (×6): qty 2

## 2017-06-18 MED ORDER — PRAMIPEXOLE DIHYDROCHLORIDE 0.25 MG PO TABS
0.5000 mg | ORAL_TABLET | Freq: Two times a day (BID) | ORAL | Status: DC
  Administered 2017-06-18 – 2017-06-21 (×4): 0.5 mg via ORAL
  Filled 2017-06-18 (×4): qty 2

## 2017-06-18 MED ORDER — HYDROMORPHONE HCL 1 MG/ML IJ SOLN
1.0000 mg | Freq: Once | INTRAMUSCULAR | Status: AC
  Administered 2017-06-18: 1 mg via INTRAVENOUS
  Filled 2017-06-18: qty 1
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
## 2017-06-18 NOTE — ED Notes (Signed)
Report to floor, Dr. Rudene Christians with patient at present. Will transfer to floor after his consult.

## 2017-06-18 NOTE — ED Notes (Signed)
Resting awake, cont painful.
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
## 2017-06-19 ENCOUNTER — Observation Stay: Payer: No Typology Code available for payment source

## 2017-06-19 ENCOUNTER — Encounter
Admission: EM | Disposition: A | Discharge status: Home / Self Care | Payer: Self-pay | Source: Home / Self Care | Attending: Emergency Medicine

## 2017-06-19 DIAGNOSIS — S32010A Wedge compression fracture of first lumbar vertebra, initial encounter for closed fracture: Secondary | ICD-10-CM | POA: Diagnosis not present

## 2017-06-19 LAB — HEMOGLOBIN A1C
Hgb A1c MFr Bld: 7.6 % — ABNORMAL HIGH (ref 4.8–5.6)
MEAN PLASMA GLUCOSE: 171.42 mg/dL

## 2017-06-19 LAB — BASIC METABOLIC PANEL
ANION GAP: 6 (ref 5–15)
BUN: 18 mg/dL (ref 6–20)
CHLORIDE: 99 mmol/L — AB (ref 101–111)
CO2: 32 mmol/L (ref 22–32)
Calcium: 9.9 mg/dL (ref 8.9–10.3)
Creatinine, Ser: 0.8 mg/dL (ref 0.44–1.00)
GFR calc Af Amer: >60 mL/min (ref >60)
Glucose, Bld: 115 mg/dL — ABNORMAL HIGH (ref 65–99)
POTASSIUM: 4.9 mmol/L (ref 3.5–5.1)
SODIUM: 137 mmol/L (ref 135–145)

## 2017-06-19 LAB — CBC
HEMATOCRIT: 36 % (ref 35.0–47.0)
Hemoglobin: 12 g/dL (ref 12.0–16.0)
MCH: 28.5 pg (ref 26.0–34.0)
MCHC: 33.3 g/dL (ref 32.0–36.0)
MCV: 85.6 fL (ref 80.0–100.0)
PLATELETS: 304 10*3/uL (ref 150–440)
RBC: 4.2 MIL/uL (ref 3.80–5.20)
RDW: 16.7 % — ABNORMAL HIGH (ref 11.5–14.5)
WBC: 7.1 10*3/uL (ref 3.6–11.0)

## 2017-06-19 LAB — GLUCOSE, CAPILLARY
GLUCOSE-CAPILLARY: 114 mg/dL — AB (ref 65–99)
GLUCOSE-CAPILLARY: 108 mg/dL — AB (ref 65–99)
Glucose-Capillary: 129 mg/dL — ABNORMAL HIGH (ref 65–99)
Glucose-Capillary: 107 mg/dL — ABNORMAL HIGH (ref 65–99)
Glucose-Capillary: 171 mg/dL — ABNORMAL HIGH (ref 65–99)

## 2017-06-19 SURGERY — KYPHOPLASTY
Anesthesia: Choice

## 2017-06-19 SURGICAL SUPPLY — 15 items
DERMABOND ADVANCED (GAUZE/BANDAGES/DRESSINGS) ×1
DERMABOND ADVANCED .7 DNX12 (GAUZE/BANDAGES/DRESSINGS) ×1 IMPLANT
DEVICE BIOPSY BONE KYPHX (INSTRUMENTS) ×2 IMPLANT
DRAPE C-ARM XRAY 36X54 (DRAPES) ×2 IMPLANT
DURAPREP 26ML APPLICATOR (WOUND CARE) ×2 IMPLANT
GLOVE SURG SYN 9.0  PF PI (GLOVE) ×1
GLOVE SURG SYN 9.0 PF PI (GLOVE) ×1 IMPLANT
GOWN SRG 2XL LVL 4 RGLN SLV (GOWNS) ×1 IMPLANT
GOWN STRL NON-REIN 2XL LVL4 (GOWNS) ×1
GOWN STRL REUS W/ TWL LRG LVL3 (GOWN DISPOSABLE) ×1 IMPLANT
GOWN STRL REUS W/TWL LRG LVL3 (GOWN DISPOSABLE) ×1
PACK KYPHOPLASTY (MISCELLANEOUS) ×2 IMPLANT
STRAP SAFETY BODY (MISCELLANEOUS) ×2 IMPLANT
TRAY KYPHOPAK 15/3 EXPRESS 1ST (MISCELLANEOUS) ×2 IMPLANT
TRAY KYPHOPAK 20/3 EXPRESS 1ST (MISCELLANEOUS) ×2 IMPLANT

## 2017-06-19 NOTE — Care Management Obs Status (Signed)
[place] NOTIFICATION   Patient Details  Name: Christina Hess MRN: [id number] Date of Birth: 25-Feb-1941   Medicare Observation Status Notification Given:  Yes. Given to dgt/son by phone. Patient confused.     Jolly Mango, RN 06/19/2017, 3:53 PM

## 2017-06-19 NOTE — Care Management CC44 (Signed)
Condition Code 44 Documentation Completed  Patient Details  Name: Christina Hess MRN: [id number] Date of Birth: Apr 30, 1941   Condition Code 44 given:  Yes Patient signature on Condition Code 44 notice:  Yes Documentation of 2 MD's agreement:  Yes Code 44 added to claim:  Yes    Jolly Mango, RN 06/19/2017, 3:53 PM
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
## 2017-06-19 NOTE — OR Nursing (Signed)
Patient came to SDS to prepare for surgery.  Patient oriented to self, states that she ate breakfast, consent is not signed.  Floor nurse Adonis Huguenin called to verify breakfast and patient's mental status.  Patient is alert and oriented x 4 with some confusion.  Patient yelling trying to take IV out, takes oxygen off, screaming she wants to go home we are fake and this is a fake hospital we are all acting, takes her EKG leads off.  Called her son who is POA and he states that she is always alert and oriented, he tries talking to patient and she will not calm down.  She continues to tell us we are fake and she is not having surgery and the trucks and pictures are moving down the hallway.  She is uncooperative, floor nurse and director come to assess patient and status.  According to Adonis Huguenin the floor nurse and family this is all new behavior.  Surgery will wait, family on the way, MD to evaluate patients mental status changes.

## 2017-06-19 NOTE — Progress Notes (Signed)
Patient was brought to operating oom today for kyphoplasty. She was very confused and agitated with a significant change in mental status since this morning and so surgery is not performed today.

## 2017-06-19 NOTE — Progress Notes (Signed)
Notified Dr. Margaretmary Eddy about the change in mental status of patient in same day surgery waiting area. Dr. Margaretmary Eddy coming up to same day surgery area to evaluate patient.

## 2017-06-20 ENCOUNTER — Encounter: Payer: Self-pay | Admitting: Anesthesiology

## 2017-06-20 ENCOUNTER — Observation Stay: Payer: No Typology Code available for payment source

## 2017-06-20 ENCOUNTER — Observation Stay: Payer: No Typology Code available for payment source | Admitting: Anesthesiology

## 2017-06-20 ENCOUNTER — Encounter
Admission: EM | Disposition: A | Discharge status: Home / Self Care | Payer: Self-pay | Source: Home / Self Care | Attending: Emergency Medicine

## 2017-06-20 DIAGNOSIS — S32010A Wedge compression fracture of first lumbar vertebra, initial encounter for closed fracture: Secondary | ICD-10-CM | POA: Diagnosis not present

## 2017-06-20 HISTORY — PX: KYPHOPLASTY: SHX5884

## 2017-06-20 LAB — GLUCOSE, CAPILLARY
GLUCOSE-CAPILLARY: 134 mg/dL — AB (ref 65–99)
GLUCOSE-CAPILLARY: 124 mg/dL — AB (ref 65–99)
Glucose-Capillary: 127 mg/dL — ABNORMAL HIGH (ref 65–99)

## 2017-06-20 SURGERY — KYPHOPLASTY
Anesthesia: Monitor Anesthesia Care

## 2017-06-20 MED ORDER — LIDOCAINE HCL (CARDIAC) 20 MG/ML IV SOLN
INTRAVENOUS | Status: DC | PRN
  Administered 2017-06-20: 80 mg via INTRAVENOUS

## 2017-06-20 MED ORDER — BUPIVACAINE-EPINEPHRINE (PF) 0.5% -1:200000 IJ SOLN
INTRAMUSCULAR | Status: DC | PRN
  Administered 2017-06-20: 27 mL

## 2017-06-20 MED ORDER — DEXMEDETOMIDINE HCL 200 MCG/2ML IV SOLN
INTRAVENOUS | Status: DC | PRN
  Administered 2017-06-20 (×2): 8 ug via INTRAVENOUS
  Administered 2017-06-20: 4 ug via INTRAVENOUS

## 2017-06-20 MED ORDER — METOCLOPRAMIDE HCL 5 MG/ML IJ SOLN: 5.0000 mg | Freq: Three times a day (TID) | INTRAMUSCULAR | Status: DC | PRN

## 2017-06-20 MED ORDER — SODIUM CHLORIDE 0.9 % IV SOLN
INTRAVENOUS | Status: DC
  Administered 2017-06-20: 18:00:00 via INTRAVENOUS

## 2017-06-20 MED ORDER — FENTANYL CITRATE (PF) 100 MCG/2ML IJ SOLN
INTRAMUSCULAR | Status: AC
  Administered 2017-06-20: 25 ug via INTRAVENOUS
  Filled 2017-06-20: qty 2

## 2017-06-20 MED ORDER — METOCLOPRAMIDE HCL 10 MG PO TABS: 5.0000 mg | ORAL_TABLET | Freq: Three times a day (TID) | ORAL | Status: DC | PRN

## 2017-06-20 MED ORDER — PROPOFOL 10 MG/ML IV BOLUS
INTRAVENOUS | Status: DC | PRN
  Administered 2017-06-20 (×2): 20 mg via INTRAVENOUS
  Administered 2017-06-20: 30 mg via INTRAVENOUS
  Administered 2017-06-20 (×2): 20 mg via INTRAVENOUS

## 2017-06-20 MED ORDER — SODIUM CHLORIDE 0.9 % IV SOLN
INTRAVENOUS | Status: DC | PRN
  Administered 2017-06-20: 12:00:00 via INTRAVENOUS

## 2017-06-20 MED ORDER — IOPAMIDOL (ISOVUE-M 200) INJECTION 41%
INTRAMUSCULAR | Status: AC
  Filled 2017-06-20: qty 20

## 2017-06-20 MED ORDER — LIDOCAINE HCL (PF) 1 % IJ SOLN
INTRAMUSCULAR | Status: AC
  Filled 2017-06-20: qty 60

## 2017-06-20 MED ORDER — PROPOFOL 500 MG/50ML IV EMUL
INTRAVENOUS | Status: AC
  Filled 2017-06-20: qty 50

## 2017-06-20 MED ORDER — BUPIVACAINE-EPINEPHRINE (PF) 0.5% -1:200000 IJ SOLN
INTRAMUSCULAR | Status: AC
  Filled 2017-06-20: qty 30

## 2017-06-20 MED ORDER — FENTANYL CITRATE (PF) 100 MCG/2ML IJ SOLN
25.0000 ug | INTRAMUSCULAR | Status: AC | PRN
  Administered 2017-06-20 (×6): 25 ug via INTRAVENOUS

## 2017-06-20 MED ORDER — DOCUSATE SODIUM 100 MG PO CAPS
100.0000 mg | ORAL_CAPSULE | Freq: Two times a day (BID) | ORAL | Status: DC
  Administered 2017-06-20 – 2017-06-21 (×2): 100 mg via ORAL
  Filled 2017-06-20 (×2): qty 1

## 2017-06-20 MED ORDER — DEXMEDETOMIDINE HCL IN NACL 80 MCG/20ML IV SOLN
INTRAVENOUS | Status: AC
  Filled 2017-06-20: qty 20

## 2017-06-20 MED ORDER — ONDANSETRON HCL 4 MG/2ML IJ SOLN: 4.0000 mg | Freq: Once | INTRAMUSCULAR | Status: DC | PRN

## 2017-06-20 MED ORDER — LIDOCAINE HCL (PF) 2 % IJ SOLN
INTRAMUSCULAR | Status: AC
  Filled 2017-06-20: qty 4

## 2017-06-20 MED ORDER — LIDOCAINE HCL 1 % IJ SOLN
INTRAMUSCULAR | Status: DC | PRN
  Administered 2017-06-20: 30 mL

## 2017-06-20 SURGICAL SUPPLY — 16 items
CEMENT KYPHON CX01A KIT/MIXER (Cement) ×3 IMPLANT
DERMABOND ADVANCED (GAUZE/BANDAGES/DRESSINGS) ×2
DERMABOND ADVANCED .7 DNX12 (GAUZE/BANDAGES/DRESSINGS) ×1 IMPLANT
DEVICE BIOPSY BONE KYPHX (INSTRUMENTS) ×3 IMPLANT
DRAPE C-ARM XRAY 36X54 (DRAPES) ×3 IMPLANT
DURAPREP 26ML APPLICATOR (WOUND CARE) ×3 IMPLANT
GLOVE SURG SYN 9.0  PF PI (GLOVE) ×2
GLOVE SURG SYN 9.0 PF PI (GLOVE) ×1 IMPLANT
GOWN SRG 2XL LVL 4 RGLN SLV (GOWNS) ×1 IMPLANT
GOWN STRL NON-REIN 2XL LVL4 (GOWNS) ×2
GOWN STRL REUS W/ TWL LRG LVL3 (GOWN DISPOSABLE) ×1 IMPLANT
GOWN STRL REUS W/TWL LRG LVL3 (GOWN DISPOSABLE) ×2
PACK KYPHOPLASTY (MISCELLANEOUS) ×3 IMPLANT
STRAP SAFETY BODY (MISCELLANEOUS) ×3 IMPLANT
TRAY KYPHOPAK 15/3 EXPRESS 1ST (MISCELLANEOUS) ×3 IMPLANT
TRAY KYPHOPAK 20/3 EXPRESS 1ST (MISCELLANEOUS) IMPLANT

## 2017-06-20 NOTE — Op Note (Signed)
06/18/2017 - 06/20/2017  1:19 PM  PATIENT:  Christina Hess  76 y.o. female  PRE-OPERATIVE DIAGNOSIS:  L1  compression fracture  POST-OPERATIVE DIAGNOSIS:  L1 compression fracture  PROCEDURE:  Procedure(s): KYPHOPLASTY-L1 (N/A)  SURGEON: Laurene Footman, MD  ASSISTANTS: None  ANESTHESIA:   local and MAC  EBL:  No intake/output data recorded.  BLOOD ADMINISTERED:none  DRAINS: none   LOCAL MEDICATIONS USED:  MARCAINE    and XYLOCAINE   SPECIMEN:  Source of Specimen:  L1 vertebral body  DISPOSITION OF SPECIMEN:  PATHOLOGY  COUNTS:  YES  TOURNIQUET:  * No tourniquets in log *  IMPLANTS: Bone cement  DICTATION: .Dragon Dictation  patient brought the operating room and after adequate sedation was given the patient was placed prone and C-arm brought in and with goodvisualization ofL1.After patient identification and timeout procedure completed forcc 1% Xylocaine was infiltrated on the both sides at L1.Marland KitchenNext the back was prepped and draped in sterile manner and repeat timeout procedure carried out. Spinal needle was used to get local anesthetic down to the pedicle on the both sides with a total of 15 cc half percent Sensorcaine with epinephrine and 15 cc Xylocaine a small incision was then made and a trocar advanced and extrapedicular fashion into the L1 body staying lateral to the medial wall the pedicle set was advanced through the pedicle with biplanar imaging used biopsy was then biopsy of L1 vertebral body was obtained. balloon was inflated to approximately 5cc with correction of inferior endplate deformity . A second trochars advanced on the left side and about 3 cc of inflation obtained with no biopsy being obtained on that side.The cement was then mixed and inserted when it was the appropriate consistency with 8.5cc of bone cement filling the L1 vertebral body getting very good interdigitation and coverage from superior to inferior medial right to left sidesl without  extravasation.Eggshell type technique was used filling the right side with cement and letting out the balloon when it was partially set and then filling from the left sideWhen the cement was set the trochar removed and permanent C-arm views obtained. Dermabond were used to close the skin followed by a Band-Aid  PLAN OF CARE: Continue observation  PATIENT DISPOSITION:  PACU - hemodynamically stable.

## 2017-06-20 NOTE — Progress Notes (Signed)
Discussed with son yesterday for consent, patient somewhat confused this am. Plan kyphoplasty at noon.

## 2017-06-20 NOTE — Transfer of Care (Signed)
Immediate Anesthesia Transfer of Care Note  Patient: Christina Hess  Procedure(s) Performed: Procedure(s): KYPHOPLASTY-L1 (N/A)  Patient Location: PACU  Anesthesia Type:MAC  Level of Consciousness: awake  Airway & Oxygen Therapy: Patient connected to nasal cannula oxygen  Post-op Assessment: Report given to RN  Post vital signs: stable  Last Vitals:  Vitals:   06/20/17 1149 06/20/17 1316  BP: (!) 107/47   Pulse: 68 60  Resp: 18 10  Temp: 36.6 C   SpO2: 96% 96%    Last Pain:  Vitals:   06/20/17 1149  TempSrc: Tympanic  PainSc:          Complications: No apparent anesthesia complications

## 2017-06-20 NOTE — Progress Notes (Signed)
MD at patient bedside
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
## 2017-06-20 NOTE — Anesthesia Post-op Follow-up Note (Signed)
Anesthesia QCDR form completed.        

## 2017-06-20 NOTE — Anesthesia Preprocedure Evaluation (Addendum)
Anesthesia Evaluation  Patient identified by MRN, date of birth, ID band Patient confused    Reviewed: Allergy & Precautions, NPO status , Patient's Chart, lab work & pertinent test results, reviewed documented beta blocker date and time   Airway Mallampati: III  TM Distance: <3 FB     Dental   Pulmonary sleep apnea , COPD,  COPD inhaler,    Pulmonary exam normal        Cardiovascular hypertension, Pt. on medications and Pt. on home beta blockers + CAD and +CHF  Normal cardiovascular exam     Neuro/Psych Anxiety CVA    GI/Hepatic GERD  Medicated,  Endo/Other  diabetes, Type 2  Renal/GU      Musculoskeletal  (+) Arthritis , Osteoarthritis,  Fibromyalgia -  Abdominal Normal abdominal exam  (+)   Peds negative pediatric ROS (+)  Hematology   Anesthesia Other Findings   Reproductive/Obstetrics                             Anesthesia Physical Anesthesia Plan  ASA: III  Anesthesia Plan: MAC and General   Post-op Pain Management:    Induction: Intravenous  PONV Risk Score and Plan:   Airway Management Planned: Nasal Cannula  Additional Equipment:   Intra-op Plan:   Post-operative Plan:   Informed Consent: I have reviewed the patients History and Physical, chart, labs and discussed the procedure including the risks, benefits and alternatives for the proposed anesthesia with the patient or authorized representative who has indicated his/her understanding and acceptance.   Dental advisory given  Plan Discussed with: CRNA and Surgeon  Anesthesia Plan Comments:         Anesthesia Quick Evaluation

## 2017-06-21 ENCOUNTER — Encounter: Payer: Self-pay | Admitting: Orthopedic Surgery

## 2017-06-21 DIAGNOSIS — S32010A Wedge compression fracture of first lumbar vertebra, initial encounter for closed fracture: Secondary | ICD-10-CM | POA: Diagnosis not present

## 2017-06-21 LAB — URINALYSIS, COMPLETE (UACMP) WITH MICROSCOPIC
BILIRUBIN URINE: NEGATIVE
Bacteria, UA: NONE SEEN
GLUCOSE, UA: NEGATIVE mg/dL
HGB URINE DIPSTICK: NEGATIVE
KETONES UR: NEGATIVE mg/dL
Leukocytes, UA: NEGATIVE
NITRITE: NEGATIVE
Protein, ur: NEGATIVE mg/dL
Specific Gravity, Urine: 1.011 (ref 1.005–1.030)
pH: 6 (ref 5.0–8.0)

## 2017-06-21 LAB — GLUCOSE, CAPILLARY
GLUCOSE-CAPILLARY: 200 mg/dL — AB (ref 65–99)
Glucose-Capillary: 114 mg/dL — ABNORMAL HIGH (ref 65–99)

## 2017-06-21 LAB — CBC
HCT: 40.2 % (ref 35.0–47.0)
Hemoglobin: 13.4 g/dL (ref 12.0–16.0)
MCH: 27.7 pg (ref 26.0–34.0)
MCHC: 33.2 g/dL (ref 32.0–36.0)
MCV: 83.6 fL (ref 80.0–100.0)
PLATELETS: 314 10*3/uL (ref 150–440)
RBC: 4.82 MIL/uL (ref 3.80–5.20)
RDW: 16.8 % — AB (ref 11.5–14.5)
WBC: 7.6 10*3/uL (ref 3.6–11.0)

## 2017-06-21 MED ORDER — DOCUSATE SODIUM 100 MG PO CAPS: 100.0000 mg | ORAL_CAPSULE | Freq: Two times a day (BID) | ORAL | 0 refills | Status: AC

## 2017-06-21 MED ORDER — OXYCODONE HCL 10 MG PO TABS: 10.0000 mg | ORAL_TABLET | ORAL | 0 refills | Status: AC | PRN
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
## 2017-06-21 NOTE — Care Management Important Message (Signed)
Important Message  Patient Details  Name: Thyra Yinger MRN: [id number] Date of Birth: 1941-02-25   Medicare Important Message Given:  Yes    Jolly Mango, RN 06/21/2017, 3:30 PM

## 2017-06-21 NOTE — Anesthesia Postprocedure Evaluation (Signed)
Anesthesia Post Note  Patient: Rowena Moilanen  Procedure(s) Performed: Procedure(s) (LRB): KYPHOPLASTY-L1 (N/A)  Patient location during evaluation: PACU Anesthesia Type: MAC Level of consciousness: awake and alert and oriented Pain management: pain level controlled Vital Signs Assessment: post-procedure vital signs reviewed and stable Respiratory status: spontaneous breathing Cardiovascular status: blood pressure returned to baseline Anesthetic complications: no     Last Vitals:  Vitals:   06/21/17 0345 06/21/17 0902  BP: (!) 158/74 (!) 157/70  Pulse: 66 60  Resp: 18 18  Temp: 36.6 C 36.9 C  SpO2: 100% 99%    Last Pain:  Vitals:   06/21/17 0902  TempSrc: Oral  PainSc:                  Carmen Vallecillo

## 2017-06-21 NOTE — Progress Notes (Signed)
Pt discharged to home via wheelchair without incident per MD order accompanied by family. No change in pt from AM assessment. Pt repts pain is "good" on discharge. Daughter was given option for mother to stay overnight if she feels the patient's mental status isn't at baseline or if she feels patient's mental status is at baseline she may take mother home. Giving the daughter an option of discharging today or tomorrow was per MD. Pt's daughter states that her mother's mental status is at baseline and she wishes to take her mother home. Pt is going to daughter's house and will not be alone. Prior to discharge all discharge teachings both written and verbal. Pt and daughter verbalize understanding and agree. All questions answered and pt discharged with family with prescription for oxycodone.

## 2017-06-21 NOTE — Care Management Note (Signed)
Case Management Note  Patient Details  Name: Christina Hess MRN: [id number] Date of Birth: Mar 17, 1941  Subjective/Objective:  PT recommending home health PT. Spoke with patient but she was very lethargic. TC to daughter,  Christina Hess. Patient normally lives with her sister. She will be staying with the daughter temporarily until she recovers and is independent again as she was prior to admission. Patient uses a walker at baseline. PCP is  Christina Hess.                 Action/Plan: Advanced for HHPT.   Expected Discharge Date:  06/20/17               Expected Discharge Plan:  [place]  In-House Referral:     Discharge planning Services  CM Consult  Post Acute Care Choice:  Home Health Choice offered to:  Adult Children  DME Arranged:    DME Agency:     HH Arranged:  PT HH Agency:  [facility]  Status of Service:  In process, will continue to follow  If discussed at Long Length of Stay Meetings, dates discussed:    Additional Comments:  Jolly Mango, RN 06/21/2017, 2:34 PM

## 2017-06-22 LAB — SURGICAL PATHOLOGY
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
## 2017-08-18 ENCOUNTER — Other Ambulatory Visit
Admission: RE | Admit: 2017-08-18 | Discharge: 2017-08-18 | Disposition: A | Discharge status: Home / Self Care | Payer: Medicare Other | Source: Ambulatory Visit | Attending: Hospice and Palliative Medicine | Admitting: Hospice and Palliative Medicine

## 2017-08-18 LAB — URINALYSIS, COMPLETE (UACMP) WITH MICROSCOPIC
BACTERIA UA: NONE SEEN
BILIRUBIN URINE: NEGATIVE
GLUCOSE, UA: NEGATIVE mg/dL
HGB URINE DIPSTICK: NEGATIVE
KETONES UR: NEGATIVE mg/dL
Leukocytes, UA: NEGATIVE
NITRITE: NEGATIVE
PH: 5 (ref 5.0–8.0)
Protein, ur: NEGATIVE mg/dL
SPECIFIC GRAVITY, URINE: 1.013 (ref 1.005–1.030)
WBC UA: NONE SEEN WBC/hpf (ref 0–5)

## 2017-08-20 LAB — URINE CULTURE: Culture: 80000 — AB

## 2017-10-02 DEATH — deceased
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
## 2018-05-25 IMAGING — XA DG C-ARM 61-120 MIN
1 series · 1 of 1 positions shown · non-contrast
Comparison: 06/18/2017 lumbar spine CT.

CLINICAL DATA: L1 kyphoplasty

EXAM:
DG C-ARM 61-120 MIN; LUMBAR SPINE - 2-3 VIEW

[Series 1: ortho standard · 1 of 1 slices shown]
[im 1/1]
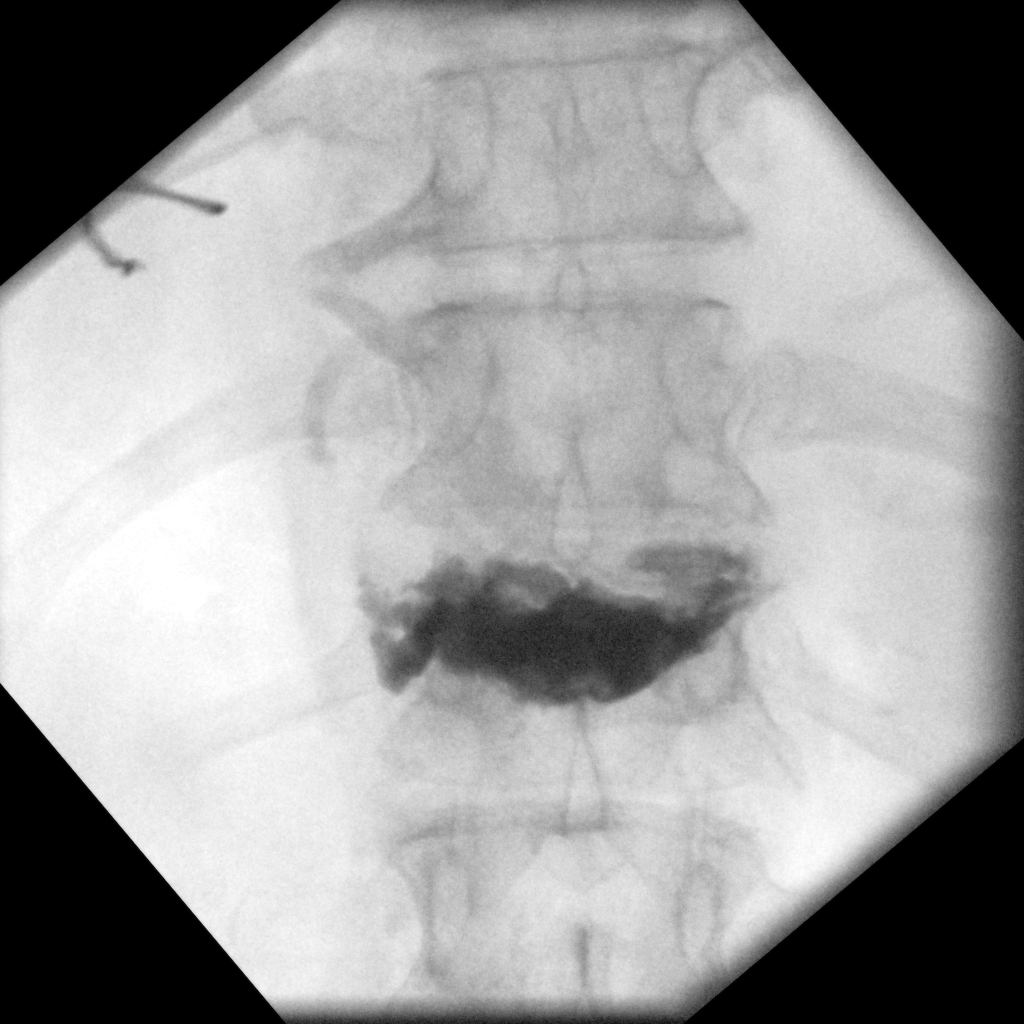

[1 of 1 positions shown; findings below may reference images not displayed]

FINDINGS: Fluoroscopy time 2 minutes 59 seconds. Two spot fluoroscopic
intraoperative radiographs demonstrate kyphoplasty material at the
site of the L1 vertebral compression fracture.
IMPRESSION: Intraoperative fluoroscopic guidance for L1 kyphoplasty.
